# Patient Record
Sex: Male | Born: 1990 | Race: Black or African American | Hispanic: No | Smoking: Current some day smoker
Health system: Southern US, Community
[De-identification: ages and names within clinical notes are randomized; demographics above are authoritative.]

## PROBLEM LIST (undated history)

## (undated) HISTORY — PX: NO PAST SURGERIES: SHX2092

---

## 2018-01-08 ENCOUNTER — Emergency Department (HOSPITAL_COMMUNITY): Payer: No Typology Code available for payment source

## 2018-01-08 ENCOUNTER — Other Ambulatory Visit: Payer: Self-pay

## 2018-01-08 ENCOUNTER — Inpatient Hospital Stay (HOSPITAL_COMMUNITY)
Admission: EM | Admit: 2018-01-08 | Discharge: 2018-01-09 | DRG: 206 | Disposition: A | Discharge status: Home / Self Care | Payer: No Typology Code available for payment source | Attending: General Surgery | Admitting: General Surgery

## 2018-01-08 ENCOUNTER — Encounter (HOSPITAL_COMMUNITY): Payer: Self-pay | Admitting: *Deleted

## 2018-01-08 ENCOUNTER — Inpatient Hospital Stay (HOSPITAL_COMMUNITY): Payer: No Typology Code available for payment source

## 2018-01-08 DIAGNOSIS — S01312A Laceration without foreign body of left ear, initial encounter: Secondary | ICD-10-CM | POA: Diagnosis present

## 2018-01-08 DIAGNOSIS — E872 Acidosis: Secondary | ICD-10-CM | POA: Diagnosis present

## 2018-01-08 DIAGNOSIS — Y92410 Unspecified street and highway as the place of occurrence of the external cause: Secondary | ICD-10-CM

## 2018-01-08 DIAGNOSIS — S270XXA Traumatic pneumothorax, initial encounter: Secondary | ICD-10-CM | POA: Diagnosis present

## 2018-01-08 DIAGNOSIS — S0181XA Laceration without foreign body of other part of head, initial encounter: Secondary | ICD-10-CM

## 2018-01-08 DIAGNOSIS — R402362 Coma scale, best motor response, obeys commands, at arrival to emergency department: Secondary | ICD-10-CM | POA: Diagnosis present

## 2018-01-08 DIAGNOSIS — R402363 Coma scale, best motor response, obeys commands, at hospital admission: Secondary | ICD-10-CM | POA: Diagnosis present

## 2018-01-08 DIAGNOSIS — Y906 Blood alcohol level of 120-199 mg/100 ml: Secondary | ICD-10-CM | POA: Diagnosis present

## 2018-01-08 DIAGNOSIS — R402133 Coma scale, eyes open, to sound, at hospital admission: Secondary | ICD-10-CM | POA: Diagnosis present

## 2018-01-08 DIAGNOSIS — S27322A Contusion of lung, bilateral, initial encounter: Principal | ICD-10-CM

## 2018-01-08 DIAGNOSIS — F10129 Alcohol abuse with intoxication, unspecified: Secondary | ICD-10-CM | POA: Diagnosis present

## 2018-01-08 DIAGNOSIS — J939 Pneumothorax, unspecified: Secondary | ICD-10-CM

## 2018-01-08 DIAGNOSIS — S12600A Unspecified displaced fracture of seventh cervical vertebra, initial encounter for closed fracture: Secondary | ICD-10-CM | POA: Diagnosis present

## 2018-01-08 DIAGNOSIS — R402243 Coma scale, best verbal response, confused conversation, at hospital admission: Secondary | ICD-10-CM | POA: Diagnosis present

## 2018-01-08 DIAGNOSIS — R402142 Coma scale, eyes open, spontaneous, at arrival to emergency department: Secondary | ICD-10-CM | POA: Diagnosis present

## 2018-01-08 DIAGNOSIS — Z23 Encounter for immunization: Secondary | ICD-10-CM

## 2018-01-08 DIAGNOSIS — R402242 Coma scale, best verbal response, confused conversation, at arrival to emergency department: Secondary | ICD-10-CM | POA: Diagnosis present

## 2018-01-08 DIAGNOSIS — F17210 Nicotine dependence, cigarettes, uncomplicated: Secondary | ICD-10-CM | POA: Diagnosis present

## 2018-01-08 DIAGNOSIS — R079 Chest pain, unspecified: Secondary | ICD-10-CM | POA: Diagnosis present

## 2018-01-08 LAB — CBC
HEMATOCRIT: 48.5 % (ref 39.0–52.0)
Hemoglobin: 15.5 g/dL (ref 13.0–17.0)
MCH: 28.2 pg (ref 26.0–34.0)
MCHC: 32 g/dL (ref 30.0–36.0)
MCV: 88.2 fL (ref 78.0–100.0)
PLATELETS: 263 10*3/uL (ref 150–400)
RBC: 5.5 MIL/uL (ref 4.22–5.81)
RDW: 13.2 % (ref 11.5–15.5)
WBC: 9.5 10*3/uL (ref 4.0–10.5)

## 2018-01-08 LAB — COMPREHENSIVE METABOLIC PANEL
ALBUMIN: 3.9 g/dL (ref 3.5–5.0)
ALT: 36 U/L (ref 0–44)
AST: 41 U/L (ref 15–41)
Alkaline Phosphatase: 54 U/L (ref 38–126)
Anion gap: 15 (ref 5–15)
BUN: 13 mg/dL (ref 6–20)
CHLORIDE: 109 mmol/L (ref 98–111)
CO2: 18 mmol/L — AB (ref 22–32)
Calcium: 8.6 mg/dL — ABNORMAL LOW (ref 8.9–10.3)
Creatinine, Ser: 0.9 mg/dL (ref 0.61–1.24)
GFR calc Af Amer: >60 mL/min (ref >60)
Glucose, Bld: 123 mg/dL — ABNORMAL HIGH (ref 70–99)
POTASSIUM: 3.6 mmol/L (ref 3.5–5.1)
SODIUM: 142 mmol/L (ref 135–145)
Total Bilirubin: 0.5 mg/dL (ref 0.3–1.2)
Total Protein: 6.8 g/dL (ref 6.5–8.1)

## 2018-01-08 LAB — RAPID URINE DRUG SCREEN, HOSP PERFORMED
Amphetamines: NOT DETECTED
BARBITURATES: NOT DETECTED
Benzodiazepines: NOT DETECTED
COCAINE: NOT DETECTED
Opiates: NOT DETECTED
Tetrahydrocannabinol: POSITIVE — AB

## 2018-01-08 LAB — URINALYSIS, ROUTINE W REFLEX MICROSCOPIC
Bilirubin Urine: NEGATIVE
GLUCOSE, UA: NEGATIVE mg/dL
Ketones, ur: NEGATIVE mg/dL
Leukocytes, UA: NEGATIVE
NITRITE: NEGATIVE
PH: 6 (ref 5.0–8.0)
Protein, ur: NEGATIVE mg/dL
SPECIFIC GRAVITY, URINE: 1.03 (ref 1.005–1.030)

## 2018-01-08 LAB — LACTIC ACID, PLASMA: Lactic Acid, Venous: 2.5 mmol/L (ref 0.5–1.9)

## 2018-01-08 LAB — TYPE AND SCREEN
ABO/RH(D): AB POS
ANTIBODY SCREEN: NEGATIVE

## 2018-01-08 LAB — ABO/RH: ABO/RH(D): AB POS

## 2018-01-08 LAB — I-STAT CHEM 8, ED
BUN: 14 mg/dL (ref 6–20)
Calcium, Ion: 1.04 mmol/L — ABNORMAL LOW (ref 1.15–1.40)
Chloride: 109 mmol/L (ref 98–111)
Creatinine, Ser: 1.2 mg/dL (ref 0.61–1.24)
Glucose, Bld: 127 mg/dL — ABNORMAL HIGH (ref 70–99)
HCT: 48 % (ref 39.0–52.0)
HEMOGLOBIN: 16.3 g/dL (ref 13.0–17.0)
Potassium: 3.5 mmol/L (ref 3.5–5.1)
SODIUM: 143 mmol/L (ref 135–145)
TCO2: 21 mmol/L — AB (ref 22–32)

## 2018-01-08 LAB — PROTIME-INR
INR: 0.96
PROTHROMBIN TIME: 12.7 s (ref 11.4–15.2)

## 2018-01-08 LAB — I-STAT CG4 LACTIC ACID, ED: Lactic Acid, Venous: 2.56 mmol/L (ref 0.5–1.9)

## 2018-01-08 LAB — ETHANOL: ALCOHOL ETHYL (B): 181 mg/dL — AB (ref <10)

## 2018-01-08 LAB — HIV ANTIBODY (ROUTINE TESTING W REFLEX): HIV Screen 4th Generation wRfx: NONREACTIVE

## 2018-01-08 LAB — CDS SEROLOGY

## 2018-01-08 MED ORDER — ONDANSETRON 4 MG PO TBDP: 4.0000 mg | ORAL_TABLET | Freq: Four times a day (QID) | ORAL | Status: DC | PRN

## 2018-01-08 MED ORDER — IOPAMIDOL (ISOVUE-300) INJECTION 61%
100.0000 mL | Freq: Once | INTRAVENOUS | Status: AC | PRN
  Administered 2018-01-08: 100 mL via INTRAVENOUS

## 2018-01-08 MED ORDER — HYDROMORPHONE HCL 1 MG/ML IJ SOLN: 1.0000 mg | INTRAMUSCULAR | Status: DC | PRN

## 2018-01-08 MED ORDER — HYDRALAZINE HCL 20 MG/ML IJ SOLN: 10.0000 mg | INTRAMUSCULAR | Status: DC | PRN

## 2018-01-08 MED ORDER — DEXTROSE-NACL 5-0.9 % IV SOLN
INTRAVENOUS | Status: DC
  Administered 2018-01-08 – 2018-01-09 (×2): via INTRAVENOUS

## 2018-01-08 MED ORDER — ONDANSETRON HCL 4 MG/2ML IJ SOLN: 4.0000 mg | Freq: Four times a day (QID) | INTRAMUSCULAR | Status: DC | PRN

## 2018-01-08 MED ORDER — TRAMADOL HCL 50 MG PO TABS
50.0000 mg | ORAL_TABLET | Freq: Four times a day (QID) | ORAL | Status: DC | PRN
  Administered 2018-01-08 (×3): 50 mg via ORAL
  Filled 2018-01-08 (×3): qty 1

## 2018-01-08 MED ORDER — FENTANYL CITRATE (PF) 100 MCG/2ML IJ SOLN
INTRAMUSCULAR | Status: AC
  Filled 2018-01-08: qty 2

## 2018-01-08 MED ORDER — SODIUM CHLORIDE 0.9 % IV BOLUS
1000.0000 mL | Freq: Once | INTRAVENOUS | Status: AC
  Administered 2018-01-08: 1000 mL via INTRAVENOUS

## 2018-01-08 MED ORDER — ACETAMINOPHEN 325 MG PO TABS: 650.0000 mg | ORAL_TABLET | Freq: Four times a day (QID) | ORAL | Status: DC | PRN

## 2018-01-08 MED ORDER — METHOCARBAMOL 500 MG PO TABS
500.0000 mg | ORAL_TABLET | Freq: Three times a day (TID) | ORAL | Status: DC | PRN
  Administered 2018-01-09: 500 mg via ORAL
  Filled 2018-01-08: qty 1

## 2018-01-08 MED ORDER — OXYCODONE HCL 5 MG PO TABS
5.0000 mg | ORAL_TABLET | ORAL | Status: DC | PRN
  Administered 2018-01-08 – 2018-01-09 (×4): 5 mg via ORAL
  Filled 2018-01-08 (×4): qty 1

## 2018-01-08 MED ORDER — ONDANSETRON HCL 4 MG/2ML IJ SOLN
4.0000 mg | Freq: Once | INTRAMUSCULAR | Status: AC
  Administered 2018-01-08: 4 mg via INTRAVENOUS

## 2018-01-08 MED ORDER — LORAZEPAM 2 MG/ML IJ SOLN
1.0000 mg | Freq: Once | INTRAMUSCULAR | Status: AC
  Administered 2018-01-08: 1 mg via INTRAVENOUS
  Filled 2018-01-08: qty 1

## 2018-01-08 MED ORDER — TETANUS-DIPHTH-ACELL PERTUSSIS 5-2.5-18.5 LF-MCG/0.5 IM SUSP
0.5000 mL | Freq: Once | INTRAMUSCULAR | Status: AC
  Administered 2018-01-08: 0.5 mL via INTRAMUSCULAR
  Filled 2018-01-08: qty 0.5

## 2018-01-08 MED ORDER — ENOXAPARIN SODIUM 40 MG/0.4ML ~~LOC~~ SOLN
40.0000 mg | SUBCUTANEOUS | Status: DC
  Administered 2018-01-09: 40 mg via SUBCUTANEOUS
  Filled 2018-01-08 (×2): qty 0.4

## 2018-01-08 MED ORDER — ONDANSETRON HCL 4 MG/2ML IJ SOLN
INTRAMUSCULAR | Status: AC
  Filled 2018-01-08: qty 2

## 2018-01-08 MED ORDER — FENTANYL CITRATE (PF) 100 MCG/2ML IJ SOLN
100.0000 ug | Freq: Once | INTRAMUSCULAR | Status: AC
  Administered 2018-01-08: 100 ug via INTRAVENOUS

## 2018-01-08 MED ORDER — LIDOCAINE HCL (PF) 1 % IJ SOLN
30.0000 mL | Freq: Once | INTRAMUSCULAR | Status: DC
  Filled 2018-01-08: qty 30

## 2018-01-08 MED ORDER — SODIUM CHLORIDE 0.9 % IV SOLN: INTRAVENOUS | Status: DC
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
## 2018-01-08 NOTE — ED Notes (Signed)
Trauma Surgeon at bedside
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
## 2018-01-08 NOTE — Plan of Care (Signed)
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
## 2018-01-08 NOTE — ED Provider Notes (Signed)
LACERATION REPAIR Performed by: Dietrich Pates Authorized by: Dietrich Pates Consent: Verbal consent obtained. Risks and benefits: risks, benefits and alternatives were discussed Consent given by: patient Patient identity confirmed: provided demographic data Prepped and Draped in normal sterile fashion Wound explored  Laceration Location: behind L ear and L face  Laceration Length: 10cm  No Foreign Bodies seen or palpated  Anesthesia: local infiltration  Local anesthetic: lidocaine 1% wo epinephrine  Anesthetic total: 10 ml  Irrigation method: syringe Amount of cleaning: standard  Skin closure: ethilon  Number of sutures: 5 behind ear, 9 on eyebrow  Technique: simple interrupted  Patient tolerance: Patient tolerated the procedure well with no immediate complications.   Dietrich Pates, PA-C 01/08/18 7209    Ward, Layla Maw, DO 01/08/18 [phone]
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
## 2018-01-08 NOTE — ED Notes (Signed)
Pt transported to CT by this RN.

## 2018-01-09 ENCOUNTER — Encounter (HOSPITAL_COMMUNITY): Payer: Self-pay

## 2018-01-09 LAB — BASIC METABOLIC PANEL
Anion gap: 8 (ref 5–15)
BUN: 6 mg/dL (ref 6–20)
CALCIUM: 8.6 mg/dL — AB (ref 8.9–10.3)
CHLORIDE: 105 mmol/L (ref 98–111)
CO2: 25 mmol/L (ref 22–32)
CREATININE: 0.9 mg/dL (ref 0.61–1.24)
GFR calc Af Amer: >60 mL/min (ref >60)
Glucose, Bld: 89 mg/dL (ref 70–99)
Potassium: 3.6 mmol/L (ref 3.5–5.1)
SODIUM: 138 mmol/L (ref 135–145)

## 2018-01-09 LAB — CBC
HCT: 46.3 % (ref 39.0–52.0)
HEMOGLOBIN: 14.9 g/dL (ref 13.0–17.0)
MCH: 27.9 pg (ref 26.0–34.0)
MCHC: 32.2 g/dL (ref 30.0–36.0)
MCV: 86.7 fL (ref 78.0–100.0)
PLATELETS: 234 10*3/uL (ref 150–400)
RBC: 5.34 MIL/uL (ref 4.22–5.81)
RDW: 13.3 % (ref 11.5–15.5)
WBC: 9.9 10*3/uL (ref 4.0–10.5)

## 2018-01-09 LAB — LACTIC ACID, PLASMA: LACTIC ACID, VENOUS: 1.7 mmol/L (ref 0.5–1.9)

## 2018-01-09 MED ORDER — OXYCODONE HCL 5 MG PO TABS: 5.0000 mg | ORAL_TABLET | Freq: Four times a day (QID) | ORAL | 0 refills | Status: DC | PRN
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
## 2018-01-09 NOTE — Progress Notes (Signed)
Provided discharge education/instructions, all questions and concerns addressed, Pt not in distress, discharged home with belongings accompanied by family. 

## 2018-01-09 NOTE — Plan of Care (Signed)
  Problem: Nutrition: Goal: Adequate nutrition will be maintained Outcome: Progressing   Problem: Elimination: Goal: Will not experience complications related to bowel motility Outcome: Progressing   Problem: Pain Managment: Goal: General experience of comfort will improve Outcome: Progressing   Problem: Safety: Goal: Ability to remain free from injury will improve Outcome: Progressing   
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
## 2018-01-09 NOTE — Discharge Instructions (Signed)
Change dry dressing daily and as needed for drainage to facial laceration Apply ice to decrease swelling Ok to shower with wound open Follow up as scheduled for suture removal, call with concerns  Apply heat to neck to help with pain Recommend taking Tylenol and Ibuprofen 3-4 times daily for the first few days to help decrease how much of the narcotic pain medication you need   Pulmonary Contusion A pulmonary contusion is a deep bruise to the lung. The bruise causes the lung tissue to swell and bleed into the surrounding area. The lungs will not work right if this happens. You may find it hard to breathe because you are not getting enough oxygen. Follow these instructions at home:  Do not take aspirin for the first few days. Take over-the-counter and prescription medicines only as told by your doctor.  Do your deep breathing exercises as told.  Return to your normal activities as told by your doctor. Talk with your doctor about: ? What activities are safe for you. ? When you can return to driving, work, school, and sports.  Keep all follow-up visits as told by your doctor. This is important. Contact a doctor if:  You have shaking chills.  You cough up thick spit (mucus). Get help right away if:  You have trouble breathing, and it gets worse.  Your chest pain gets worse.  You cough up blood.  You make high-pitched whistling sounds when you breathe (wheeze).  Your lips or nails look blue.  You feel dizzy or you feel like you may pass out (faint).  You feel confused.  You have a fever. This information is not intended to replace advice given to you by your health care provider. Make sure you discuss any questions you have with your health care provider. Document Released: 04/07/2011 Document Revised: 09/24/2015 Document Reviewed: 05/26/2014 Elsevier Interactive Patient Education  2018 [employer]    Opioid Pain Medicine Information Opioids are powerful medicines  that are used to treat moderate to severe pain. Opioids should be taken with the supervision of a trained health care provider. They should be taken for the shortest period of time as possible. This is because opioids can be addictive and the longer you take opioids, the greater your risk of addiction (opioid use disorder). What do opioids do? Opioids help to reduce or eliminate pain. When used for short periods of time, they can help you:  Sleep better.  Do better in physical or occupational therapy.  Feel better in the first few days after an injury.  Recover from surgery.  What is a pain treatment plan? A pain treatment plan is an agreement between you and your health care provider. Pain is unique to each person, and treatments vary depending on your condition. To manage your pain successfully, you and your health care provider need to understand each other and work together. To help you do this:  Discuss the goals of your treatment, including how much pain you might expect to have and how you will manage the pain.  Review the risks and benefits of taking opioid medicines for your condition.  Remember that a good treatment plan uses more than one approach and minimizes the chance of side effects.  Be honest about the amount of medicines you take, and about any drug or alcohol use.  Get pain medicine prescriptions from only one care provider.  Keep all follow-up visits as told by your health care provider. This is important.  What instructions should  I follow while taking opioid pain medicine? While you are taking the medicine and for 8 hours after you stop taking the medicine, follow these instructions:  Do not drive.  Do not use machinery or power tools.  Do not sign legal documents.  Do not drink alcohol.  Do not take sleeping pills.  Do not supervise children by yourself.  Do not participate in activities that require climbing or being in high places.  Do not enter a  body of water--such as a lake, river, ocean, spa, or swimming pool--unless an adult is nearby who can monitor and help you.  What kinds of side effects can opioids cause? Opioids can cause side effects, such as:  Constipation.  Nausea.  Vomiting.  Drowsiness.  Confusion.  Opioid use disorder.  Breathing difficulties (respiratory depression).  Using opioid pain medicines for longer than 3 days increases your risk of these side effects. Taking opioid pain medicine for a long period of time can affect your ability to do daily tasks. It also puts you at risk for:  Motor vehicle accidents.  Depression.  Suicide.  Heart attack.  Overdose, which can sometimes lead to death.  What are alternative ways to manage pain? Pain can be managed with many types of alternative treatments. Ask your health care provider to refer you to one or more specialists who can help you manage pain through:  Physical or occupational therapy.  Counseling (cognitive behavioral therapy).  Good nutrition.  Biofeedback.  Massage.  Meditation.  Non-opioid medicine.  Following a gentle exercise program.  How can I keep others safe while I am taking opioid pain medicine?  Keep pain medicine in a locked cabinet, or in a secure area where children cannot reach it.  Never share your pain medicine with anyone.  Do not save any leftover pills. If you have leftover medicine, you can: 1. Bring the medicine to a prescription take-back program. This is usually offered by the county or Patent examiner. 2. Throw it out in the trash. To do this:  Mix the medicine with undesirable trash such as pet waste or food.  Put the mixture in a sealed container or plastic bag.  Throw it in the trash.  Destroy any personal information on the prescription bottle. How do I stop taking opioids if I have been taking them for a long time? If you have been taking opioid medicine for more than a few weeks, you may  need to slowly decrease (taper) how much you take until you stop completely. Tapering your use of opioids can decrease your chances of experiencing withdrawal symptoms, such as:  Pain and cramping in the abdomen.  Nausea.  Sweating.  Sleepiness.  Restlessness.  Uncontrollable shaking (tremors).  Cravings for the medicine.  Do not attempt to taper your use of opioids on your own. Talk with your health care provider about how to do this. Your health care provider may prescribe a step-down schedule based on how much medicine you are taking and how long you have been taking it. Where to find support: If you have been taking opioids for a long time, you may benefit from receiving support for quitting from a local support group or counselor. Ask your health care provider for a referral to these resources in your area. Where to find more information:  Centers for Disease Control and Prevention (CDC): DiscoHelp.si Get help right away if: Seek medical care right away if you are taking opioids and you (or people close to you)  notice any of the following:  Difficulty breathing.  Breathing that is slower or more shallow than normal.  A very slow heartbeat (pulse).  Severe confusion.  Unconsciousness.  Sleepiness.  Slurred speech.  Nausea and vomiting.  Cold, clammy skin.  Blue lips or fingernails.  Limpness.  Abnormally small pupils.  If you think that you or someone else may have taken too much of an opioid medicine, get medical help right away. Do not wait to see if the symptoms go away on their own.  If you ever feel like you may hurt yourself or others, or have thoughts about taking your own life, get help right away. You can go to your nearest emergency department or call:  Your local emergency services (911 in the [place]  The hotline of the [facility] 781-815-5261 in the [place]  A suicide crisis helpline, such as  the National Suicide Prevention Lifeline at 669 440 0843. This is open 24 hours a day.  Summary  Opioid medicines can help you manage moderate to severe pain for a short period of time.  Discuss the goals of your treatment with your health care provider, including how much pain you might expect to have and how you will manage the pain.  A good treatment plan uses more than one approach. Pain can be managed with many types of alternative treatments.  If you think that you or someone else may have taken too much of an opioid, get medical help right away. This information is not intended to replace advice given to you by your health care provider. Make sure you discuss any questions you have with your health care provider. Document Released: 05/15/2015 Document Revised: 08/05/2016 Document Reviewed: 11/28/2014 Elsevier Interactive Patient Education  [employer].

## 2018-01-17 ENCOUNTER — Inpatient Hospital Stay: Payer: Self-pay
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
## 2022-10-25 ENCOUNTER — Other Ambulatory Visit: Payer: Self-pay

## 2022-10-25 ENCOUNTER — Encounter (HOSPITAL_BASED_OUTPATIENT_CLINIC_OR_DEPARTMENT_OTHER): Payer: Self-pay

## 2022-10-25 ENCOUNTER — Emergency Department (HOSPITAL_BASED_OUTPATIENT_CLINIC_OR_DEPARTMENT_OTHER)
Admission: EM | Admit: 2022-10-25 | Discharge: 2022-10-25 | Disposition: A | Discharge status: Home / Self Care | Payer: Self-pay | Attending: Emergency Medicine | Admitting: Emergency Medicine

## 2022-10-25 DIAGNOSIS — K0401 Reversible pulpitis: Secondary | ICD-10-CM | POA: Insufficient documentation

## 2022-10-25 MED ORDER — HYDROCODONE-ACETAMINOPHEN 5-325 MG PO TABS
1.0000 | ORAL_TABLET | Freq: Once | ORAL | Status: AC
  Administered 2022-10-25: 1 via ORAL
  Filled 2022-10-25: qty 1

## 2022-10-25 MED ORDER — AMOXICILLIN-POT CLAVULANATE 875-125 MG PO TABS
1.0000 | ORAL_TABLET | Freq: Once | ORAL | Status: AC
  Administered 2022-10-25: 1 via ORAL
  Filled 2022-10-25: qty 1

## 2022-10-25 MED ORDER — HYDROCODONE-ACETAMINOPHEN 5-325 MG PO TABS: 2.0000 | ORAL_TABLET | Freq: Every evening | ORAL | 0 refills | Status: AC | PRN

## 2022-10-25 MED ORDER — AMOXICILLIN-POT CLAVULANATE 875-125 MG PO TABS: 1.0000 | ORAL_TABLET | Freq: Two times a day (BID) | ORAL | 0 refills | Status: AC

## 2022-10-25 NOTE — ED Notes (Signed)
Pt verbalized understanding of d/c instructions, meds, and followup care. Denies questions. VSS, no distress noted. Steady gait to exit with all belongings.Family to drive home. 

## 2022-10-25 NOTE — ED Triage Notes (Signed)
Patient here POV from Home.  Endorses recent Dental Infection and had a Acupuncturist then. Still endorses Pain to Same Area as well as Face, Neck, and Head.   No Known Fevers. Some Nausea last PM that subsided. Currently taking Motrin and Amoxicillin.   NAD Noted During Triage. A&Ox4. GCS 15. Ambulatory.

## 2022-10-25 NOTE — Discharge Instructions (Addendum)
You are seen in the emergency room for toothache and associated neck pain, headache. We do not suspect that you have meningitis. We do think that you have pulpitis and possible periapical abscess.  This condition does have the potential to spread into the soft tissue of your mouth and then ultimately your bones and brain.  Therefore, it is critical that you follow-up with dentist to get definitive care as soon as possible.  Start taking the antibiotics that are prescribed. Norco is only for severe nighttime dental pain which is preventing you from sleeping. Take ibuprofen and Tylenol for daytime pain.  Return to the emergency room immediately if there is any of the following: painful rash Fever, chills difficulty swallowing trismus, or difficulty opening the mouth Confusion, severe headache, seizure-like activity  Call your dentist to see if they can see you sooner.

## 2022-10-25 NOTE — ED Provider Notes (Signed)
[place] EMERGENCY DEPARTMENT AT [facility] Provider Note   CSN: [id number] Arrival date & time: 10/25/22  1136     History  Chief Complaint  Patient presents with   Dental Pain    Alec Simpson is a 32 y.o. male.  HPI    32 year old male comes in with chief complaint of neck pain, headache, facial pain, tooth ache.  Patient has been having dental issues for the last 3 weeks.  He had gone to a dentist and had a temporary crown placed last week.  At that time he was told that he actually needs root canal, but the cost of the root canal would be $1500 and patient could not afford it so he requested to not proceed with root canal.  Patient is currently taking amoxicillin 3 times a day.  He still has pain over the left upper tooth.  He has also noted some pain radiating to his ear, over her neck going to the back of his head.  Patient did indicate feeling little stiff to his neck and had a telemetry doc evaluation earlier today.  Telemetry doc provider told him to come to the emergency room because of headache, neck pain and concerns for possible deep space infection.  Patient denies any fevers, chills.  He did have an episode of nausea and sweating yesterday.  He denies any difficulty in breathing, swallowing and denies any confusion or severe headaches.  Patient is immunocompetent.  Home Medications Prior to Admission medications   Medication Sig Start Date End Date Taking? Authorizing Provider  amoxicillin-clavulanate (AUGMENTIN) 875-125 MG tablet Take 1 tablet by mouth every 12 (twelve) hours. 10/25/22  Yes Derwood Kaplan, MD  HYDROcodone-acetaminophen (NORCO/VICODIN) 5-325 MG tablet Take 2 tablets by mouth at bedtime as needed for severe pain. 10/25/22  Yes Derwood Kaplan, MD      Allergies    Patient has no known allergies.    Review of Systems   Review of Systems  All other systems reviewed and are negative.   Physical Exam Updated Vital Signs BP 116/75    Pulse 65   Temp 98 F (36.7 C) (Oral)   Resp 18   Ht 6' (1.829 m)   Wt 95.3 kg   SpO2 100%   BMI 28.48 kg/m  Physical Exam Vitals and nursing note reviewed.  Constitutional:      Appearance: He is well-developed. He is not ill-appearing or toxic-appearing.  HENT:     Head: Atraumatic.     Mouth/Throat:     Comments: Pt has no trismus, stridor or crepitus over the neck.  There is no concerning cervical anterior or lymphadenopathy.  Patient has poor dentition, including evidence of missing teeth. The upper molar tooth that had crown placement looks overall intact.  There is no gingival abscess. Eyes:     Extraocular Movements: Extraocular movements intact.     Pupils: Pupils are equal, round, and reactive to light.  Neck:     Comments: Patient does not have any meningismus Cardiovascular:     Rate and Rhythm: Normal rate.  Pulmonary:     Effort: Pulmonary effort is normal.  Musculoskeletal:     Cervical back: Neck supple. No rigidity.  Skin:    General: Skin is warm.  Neurological:     Mental Status: He is alert and oriented to person, place, and time.     ED Results / Procedures / Treatments   Labs (all labs ordered are listed, but only abnormal results are  displayed) Labs Reviewed - No data to display  EKG None  Radiology No results found.  Procedures Procedures    Medications Ordered in ED Medications  HYDROcodone-acetaminophen (NORCO/VICODIN) 5-325 MG per tablet 1 tablet (1 tablet Oral Given 10/25/22 1253)  amoxicillin-clavulanate (AUGMENTIN) 875-125 MG per tablet 1 tablet (1 tablet Oral Given 10/25/22 1253)    ED Course/ Medical Decision Making/ A&P                             Medical Decision Making Risk Prescription drug management.   Pt comes in with cc of dental pain, neck pain, headache.  He has history of recent crown placement.  He has been having dental issues for the last 3 weeks.  He is already on amoxicillin.  He had a telemetry doc  evaluation for his face, neck pain and headache -and they prompted him to come to the emergency room.  Patient had singular episode of nausea yesterday, otherwise no systemic symptoms and is nontoxic-appearing, he is immunocompetent and I am not clinically concerned for meningitis.  Differential diagnosis for him includes referred pain because of: - Periapical tooth infection - Dental abscess - Pulpitis - Nerve root compression  We do not have any concerns for deep space infection either.  We will change patient's amoxicillin to Augmentin to be on the safe side.  I have advised patient to call his dentist, informed him that this is pulpitis, the definitive treatment will be root canal.  I also told him that there is likely periapical infection that will need dentist to clear out properly.  Patient is very comfortable with our assessment and also informs me that he does not want blood work or LP, if you do not think he has meningitis.  I informed him that the symptoms can change over time, that untreated dental infection can become meningitis which is why the [facility] team member probably sent him to the ER with his mentioning of headache, neck pain and face pain.  So if patient starts having worsening symptoms, confusion, altered mental status, worsening headache, fevers, sweats then he needs to come to the emergency room.  More importantly, advised that he causes dentist for prompt follow-up.  Final Clinical Impression(s) / ED Diagnoses Final diagnoses:  Acute pulpitis    Rx / DC Orders ED Discharge Orders          Ordered    HYDROcodone-acetaminophen (NORCO/VICODIN) 5-325 MG tablet  At bedtime PRN        10/25/22 1256    amoxicillin-clavulanate (AUGMENTIN) 875-125 MG tablet  Every 12 hours        10/25/22 1257              Derwood Kaplan, MD 10/25/22 1505

## 2022-11-21 ENCOUNTER — Ambulatory Visit: Payer: Self-pay | Admitting: Family

## 2024-01-09 ENCOUNTER — Ambulatory Visit: Admission: EM | Admit: 2024-01-09 | Discharge: 2024-01-09 | Disposition: A | Discharge status: Home / Self Care

## 2024-01-09 NOTE — ED Notes (Signed)
 Pt will do workers comp. Information given during triage for [employer].
